# Patient Record
Sex: Female | Born: 1965 | Race: White | Hispanic: No | Marital: Married | Smoking: Never smoker
Health system: Southern US, Community
[De-identification: ages and names within clinical notes are randomized; demographics above are authoritative.]

## PROBLEM LIST (undated history)

## (undated) DIAGNOSIS — Z6830 Body mass index (BMI) 30.0-30.9, adult: Secondary | ICD-10-CM

## (undated) DIAGNOSIS — F419 Anxiety disorder, unspecified: Secondary | ICD-10-CM

## (undated) DIAGNOSIS — I10 Essential (primary) hypertension: Secondary | ICD-10-CM

## (undated) DIAGNOSIS — E782 Mixed hyperlipidemia: Secondary | ICD-10-CM

## (undated) DIAGNOSIS — R0609 Other forms of dyspnea: Secondary | ICD-10-CM

## (undated) HISTORY — DX: Mixed hyperlipidemia: E78.2

## (undated) HISTORY — DX: Body mass index (BMI) 30.0-30.9, adult: Z68.30

## (undated) HISTORY — PX: BREAST CYST ASPIRATION: SHX578

## (undated) HISTORY — DX: Other forms of dyspnea: R06.09

## (undated) HISTORY — DX: Essential (primary) hypertension: I10

## (undated) HISTORY — PX: AUGMENTATION MAMMAPLASTY: SUR837

## (undated) HISTORY — DX: Anxiety disorder, unspecified: F41.9

---

## 1998-09-01 ENCOUNTER — Other Ambulatory Visit: Admission: RE | Admit: 1998-09-01 | Discharge: 1998-09-01 | Payer: Self-pay | Admitting: Obstetrics and Gynecology

## 1998-09-13 ENCOUNTER — Ambulatory Visit (HOSPITAL_COMMUNITY): Admission: RE | Admit: 1998-09-13 | Discharge: 1998-09-13 | Payer: Self-pay | Admitting: Obstetrics and Gynecology

## 1998-09-13 ENCOUNTER — Encounter: Payer: Self-pay | Admitting: Obstetrics and Gynecology

## 2000-01-01 ENCOUNTER — Other Ambulatory Visit: Admission: RE | Admit: 2000-01-01 | Discharge: 2000-01-01 | Payer: Self-pay | Admitting: Obstetrics and Gynecology

## 2001-01-01 ENCOUNTER — Other Ambulatory Visit: Admission: RE | Admit: 2001-01-01 | Discharge: 2001-01-01 | Payer: Self-pay | Admitting: Obstetrics and Gynecology

## 2002-02-24 ENCOUNTER — Other Ambulatory Visit: Admission: RE | Admit: 2002-02-24 | Discharge: 2002-02-24 | Payer: Self-pay | Admitting: Obstetrics and Gynecology

## 2003-05-25 ENCOUNTER — Other Ambulatory Visit: Admission: RE | Admit: 2003-05-25 | Discharge: 2003-05-25 | Payer: Self-pay | Admitting: Obstetrics and Gynecology

## 2004-07-06 ENCOUNTER — Other Ambulatory Visit: Admission: RE | Admit: 2004-07-06 | Discharge: 2004-07-06 | Payer: Self-pay | Admitting: Obstetrics and Gynecology

## 2005-10-16 ENCOUNTER — Other Ambulatory Visit: Admission: RE | Admit: 2005-10-16 | Discharge: 2005-10-16 | Payer: Self-pay | Admitting: Obstetrics and Gynecology

## 2006-01-14 ENCOUNTER — Encounter (INDEPENDENT_AMBULATORY_CARE_PROVIDER_SITE_OTHER): Payer: Self-pay | Admitting: Specialist

## 2006-01-14 ENCOUNTER — Inpatient Hospital Stay (HOSPITAL_COMMUNITY): Admission: RE | Admit: 2006-01-14 | Discharge: 2006-01-15 | Payer: Self-pay | Admitting: Obstetrics and Gynecology

## 2006-11-18 ENCOUNTER — Encounter: Admission: RE | Admit: 2006-11-18 | Discharge: 2006-11-18 | Payer: Self-pay | Admitting: Obstetrics and Gynecology

## 2007-12-24 ENCOUNTER — Encounter: Admission: RE | Admit: 2007-12-24 | Discharge: 2007-12-24 | Payer: Self-pay | Admitting: Obstetrics and Gynecology

## 2009-01-31 ENCOUNTER — Encounter: Admission: RE | Admit: 2009-01-31 | Discharge: 2009-01-31 | Payer: Self-pay | Admitting: Obstetrics and Gynecology

## 2009-02-08 ENCOUNTER — Encounter: Admission: RE | Admit: 2009-02-08 | Discharge: 2009-02-08 | Payer: Self-pay | Admitting: Obstetrics and Gynecology

## 2010-10-10 ENCOUNTER — Encounter: Admission: RE | Admit: 2010-10-10 | Discharge: 2010-10-10 | Payer: Self-pay | Admitting: Obstetrics and Gynecology

## 2010-10-10 ENCOUNTER — Ambulatory Visit: Payer: Self-pay | Admitting: Oncology

## 2010-11-09 ENCOUNTER — Ambulatory Visit: Payer: Self-pay | Admitting: Genetic Counselor

## 2011-04-19 NOTE — Op Note (Signed)
[name], Stacie Miller NO.:  [id number]   MEDICAL RECORD NO.:  [id number]          PATIENT TYPE:  INP   LOCATION:  9302                          FACILITY:  [facility]   PHYSICIAN:  Malva Limes, M.D.    DATE OF BIRTH:  Sep 02, 1966   DATE OF PROCEDURE:  01/14/2006  DATE OF DISCHARGE:                                 OPERATIVE REPORT   PREOPERATIVE DIAGNOSES:  1.  Uterine fibroids.  2.  Menorrhagia.  3.  Stress urinary incontinence.  4.  Symptomatic rectocele.   POSTOPERATIVE DIAGNOSES:  1.  Uterine fibroids.  2.  Menorrhagia.  3.  Stress urinary incontinence.  4.  Symptomatic rectocele.   OPERATION/PROCEDURE:  Total vaginal hysterectomy.   SURGEON:  Malva Limes, M.D.   ASSISTANT:  Randye Lobo, M.D.   ANESTHESIA:  General.   ANTIBIOTICS:  Ancef 1 g.   ESTIMATED BLOOD LOSS:  150 mL.   SPECIMENS:  Uterus and cervix sent to pathology.   COMPLICATIONS:  None.   DRAINS:  Foley to bedside drainage.   OPERATION/PROCEDURE:  The patient was taken to the operating room where she  was placed in the dorsal supine position.  A general anesthetic was  administered without complications.  She was then placed in the dorsal  lithotomy position. She was prepped with Betadine and her bladder was  drained with the red rubber catheter.  She was then a sterile speculum was  placed in the vagina and 10 mL of 1% lidocaine used for paracervical block.  The uterus was grasped with single-tooth tenaculum.  Posterior cul-de-sac  was entered sharply. Uterosacral ligament were bilaterally clamped, cut and  ligated with 0 Monocryl suture.  The cervix was then circumscribed.  The  anterior cul-de-sac was entered sharply.  The bladder pillars were  bilaterally clamped, cut and ligated with 0 Monocryl suture.  The cardinal  ligaments were serially clamped, cut and ligated with 0 Monocryl suture.  Uterine vessels were bilaterally clamped, cut and ligated with 0 Monocryl  suture.   Once the level of the round ligament was reached, the uterus was  inverted and the triple pedicle which included the ovarian ligament, the  fallopian tube and the round ligament were bilaterally clamped, cut and  ligated x2 with 0 Monocryl suture.  Pedicles were all checked.  At this  point there was a small area of bleeding on the pedicle on the left.  This  was made hemostatic with interrupted 0 Monocryl suture in figure-of-eight  fashion.  Ovaries appear to be normal bilaterally.  At this point the  posterior cuff was closed using 2-0 Vicryl in a running locking fashion.  At  this point Dr. Edward Jolly took over the procedure at which time she performed a  TVT sling, anterior and posterior colporrhaphy.  A complete description will  be dictated by her.           ______________________________  Malva Limes, M.D.     MA/MEDQ  D:  01/14/2006  T:  01/15/2006  Job:  [id number]

## 2011-04-19 NOTE — Discharge Summary (Signed)
NAMELATRESA, GASSER NO.:  [id number]   MEDICAL RECORD NO.:  [id number]          PATIENT TYPE:  INP   LOCATION:  9302                          FACILITY:  [facility]   PHYSICIAN:  Malva Limes, M.D.    DATE OF BIRTH:  1966/03/07   DATE OF ADMISSION:  01/14/2006  DATE OF DISCHARGE:  01/15/2006                                 DISCHARGE SUMMARY   PRINCIPAL DISCHARGE DIAGNOSES:  1.  Menorrhagia.  2.  Uterine fibroids.  3.  Stress urinary incontinence.   PRINCIPAL PROCEDURES:  1.  Total vaginal hysterectomy.  2.  Placement of TVT tape.  3.  Anterior and posterior colporrhaphy.   HISTORY OF PRESENT ILLNESS:  Ms. Coulson is a 45 year old white female, G1,  P0, who presented at [facility] for a total vaginal hysterectomy with  anterior and posterior repair, and sling urethropexy on January 14, 2006.  This was performed without difficulty.  A complete description of this can  be found in the dictated operative notes.  The patient's postoperative  course was benign.  On postoperative day #1, her hemoglobin was 11.2, she  was ambulating without difficulty and eating a regular diet.  The patient  did have attempt at a voiding trial, which was not successful.  Therefore,  the patient was discharged to home with her Foley catheter in place and  instructed to followup at the office.  The patient initially had some  nausea, which was felt to be secondary to the morphine, this resolved and  the patient was discharged to home on postoperative day #1, and she was  instructed to follow up with Dr. Edward Jolly for management of her catheter.  She  was sent home with Percocet to take p.r.n.  She will follow up with me in 4  weeks.           ______________________________  Malva Limes, M.D.     MA/MEDQ  D:  02/03/2006  T:  02/03/2006  Job:  [id number]

## 2011-04-23 NOTE — Op Note (Signed)
Stacie Miller, DINGLEDINE NO.:  [id number]   MEDICAL RECORD NO.:  [id number]          PATIENT TYPE:  INP   LOCATION:  9399                          FACILITY:  [facility]   PHYSICIAN:  Randye Lobo, M.D.   DATE OF BIRTH:  1966-10-08   DATE OF PROCEDURE:  01/14/2006  DATE OF DISCHARGE:                                 OPERATIVE REPORT   PREOPERATIVE DIAGNOSIS:  1.  Genuine stress incontinence.  2.  Incomplete uterovaginal prolapse.   POSTOPERATIVE DIAGNOSIS:  1.  Genuine stress incontinence.  2.  Incomplete uterovaginal prolapse.   PROCEDURE:  McCall culdoplasty, anterior and posterior colporrhaphy, tension-  free vaginal tape, cystoscopy.   SURGEON:  Conley Simmonds, M.D.   ASSISTANT:  Malva Limes, M.D.   ANESTHESIA:  LMA, local was 1% lidocaine with 1:100,000 of epinephrine.   IV FLUIDS:  Total for procedure 1500 mL Ringer's lactate.   ESTIMATED BLOOD LOSS:  150 mL.   URINE OUTPUT:  Quantity sufficient.   COMPLICATIONS:  None.   INDICATIONS FOR PROCEDURE:  The patient is a 45 year old Caucasian female  with a Merina IUD who presented to her primary gynecologist, Dr. Malva Limes for evaluation of abnormal uterine bleeding. The patient was noted  to have uterine leiomyoma. The patient also reported symptoms of urinary  leakage with stressful maneuvers and she wished for definitive treatment of  her bleeding and urinary incontinence. The patient did have evaluation which  documented genuine stress incontinence. The plan is made to proceed with a  total vaginal hysterectomy and possible bilateral salpingo-oophorectomy by  Dr. Dareen Piano and to proceed with the prolapse repair with an anterior and  posterior colporrhaphy, tension-free vaginal tape, and cystoscopy by me.  Risks, benefits, and alternatives have been discussed with the patient who  wishes to proceed.   FINDINGS:  The patient was noted to have first-degree uterine prolapse, a  first to second-degree  cystocele, and first to second-degree rectocele. The  uterine specimen did document a 3 cm left fundal fibroid. The Maxie Better IUD was  noted to be in place in the uterus.   Cystoscopy after placement of the sling documented the absence of a foreign  body in the vagina and the urethra. The ureters were noted be patent  bilaterally. The bladder was visualized throughout 360 degrees and had a  normal bladder dome and trigone.   SPECIMENS:  None.   PROCEDURE:  The patient was reidentified in the preoperative hold area. She  did receive Ancef 1 gram IV for antibiotic prophylaxis. The patient was  taken down to the operating room where an LMA anesthetic was performed. The  vagina and the perineum were sterilely prepped and draped and she was placed  in the high lithotomy position. An I&O catheterization was performed.   Dr. Dareen Piano at this time performed a total vaginal hysterectomy. Please  refer to this dictation separately. After Dr. Dareen Piano closed the posterior  vaginal cuff with a running locked suture of 0 Vicryl, he turned the case  over to me. There was some bleeding noted along the pedicle sites on both  the right and the left-hand side just above the level of the uterosacral  ligaments and I placed figure-of-eight sutures of 0 Monocryl bilaterally  which created good hemostasis. The remaining pedicles from the hysterectomy  were hemostatic.   I performed a McCall culdoplasty next using a suture of 0 Vicryl. The suture  was brought from the vagina into the cul-de-sac at the 6 o'clock position,  through the uterosacral ligament on the patient's left-hand side, up across  the posterior cul-de-sac, down through the uterosacral ligament on the  patient's right-hand side and then out through the vagina. This was held  until the end of the case at which time it was tied for excellent elevation  of the vaginal cuff.   The anterior colporrhaphy was performed next. Allis clamps were used  to mark  the midline of the vaginal mucosa to the level of 1 cm below the urethral  meatus. The vaginal mucosa was injected with 1% lidocaine with 1:100,000 of  epinephrine. The vaginal mucosa was then incised in a vertical fashion using  the Metzenbaum scissors. The bladder and the subvaginal tissue were then  dissected off of the overlying vaginal mucosa bilaterally using a  combination of sharp and blunt dissection. The dissection was carried back  to the level of the pubic rami without entering the retropubic space  bilaterally.   The patient had a Foley catheter which was left to gravity drainage  throughout the remainder of her procedure. Two suprapubic incisions were  then marked 2 cm to the right and the left of the midline above the pubic  symphysis. The incisions were created sharply with the scalpel. The TVT  procedure was performed in a top-down fashion. The abdominal needle passer  was placed first through the right suprapubic incision and out through the  vagina lateral to the urethra at the level of the mid urethra. The same  procedure that was performed on the right-hand side was then repeated on the  left-hand side, again in a top-down fashion. Foley catheter was removed and  cystoscopy was performed and the findings were as noted above. The Foley  catheter was replaced and the bladder was redrained and the TVT sling was  attached to the abdominal needle passers and was drawn up through the  suprapubic incisions bilaterally. The plastic sheaths were separated from  the surrounding sling material and the sheaths were withdrawn while  protecting the sling below by placing a Kelly clamp between the sling and  the urethra. Final tension was adjusted and was noted to be proper for the  sling. Excess sling was then trimmed suprapubically. The anterior  colporrhaphy was performed next with vertical mattress sutures of 2-0 Vicryl for excellent reduction of the cystocele. Excess  vaginal mucosa was trimmed  and the anterior vaginal wall was closed with a running locked suture of 2-0  Vicryl. This was continued along the vaginal cuff to completely close the  cuff at this time.   The posterior colporrhaphy was performed next. Allis clamps were once again  used to mark the midline of the posterior vaginal wall which was injected  with 1% lidocaine with 1:200,000 of epinephrine. A triangular wedge of  epithelium was removed from the perineal body. The vaginal mucosa was  incised vertically posteriorly. The perirectal fascia was dissected off of  the overlying vaginal mucosa bilaterally to the top of the rectocele. The  posterior colporrhaphy was performed with a combination of 2-0 Vicryl at the  top  of the vagina which transitioned down to 0 Vicryl along the perineum.  These were all vertical mattress sutures which reduced the rectocele well.  Hemostasis was created with monopolar cautery. In the midline of the  posterior colporrhaphy there was some oozing where one of the colporrhaphy  sutures had been placed and this was reinforced with both vertical mattress  and then simple through-and-through sutures of 2-0 Vicryl which did create  hemostasis. Excess vaginal mucosa was then trimmed from the posterior vagina  and the perineum and the posterior vaginal wall was closed with a running  locked suture of 2-0 Vicryl down to the level of the hymen. A crown stitch  of 0 Vicryl was placed along the perineal body. The remainder of the  perineal closure was performed with a suture of 2-0 Vicryl as for an  episiotomy. The perineum was closed with a subcuticular suture and the knot  was then tied at the hymen. The McCall culdoplasty suture was then tied and  there was excellent elevation and support of the vaginal cuff.   Final rectal exam confirmed the absence of sutures in the rectum. The  suprapubic incisions were closed with Dermabond. A vaginal gauze packing  with  Estrace was placed in the vagina. The Foley catheter was left to  gravity drainage.   This concluded the patient's procedure. There were no complications. All  needle, instrument, sponge counts were correct.      Randye Lobo, M.D.  Electronically Signed     BES/MEDQ  D:  01/14/2006  T:  01/14/2006  Job:  [id number]

## 2011-07-16 ENCOUNTER — Other Ambulatory Visit: Payer: Self-pay | Admitting: Obstetrics and Gynecology

## 2011-07-16 DIAGNOSIS — N6320 Unspecified lump in the left breast, unspecified quadrant: Secondary | ICD-10-CM

## 2011-07-19 ENCOUNTER — Ambulatory Visit
Admission: RE | Admit: 2011-07-19 | Discharge: 2011-07-19 | Disposition: A | Discharge status: Home / Self Care | Payer: BC Managed Care – PPO | Source: Ambulatory Visit | Attending: Obstetrics and Gynecology | Admitting: Obstetrics and Gynecology

## 2011-07-19 ENCOUNTER — Other Ambulatory Visit: Payer: Self-pay | Admitting: Obstetrics and Gynecology

## 2011-07-19 DIAGNOSIS — N6320 Unspecified lump in the left breast, unspecified quadrant: Secondary | ICD-10-CM

## 2011-07-19 DIAGNOSIS — N6310 Unspecified lump in the right breast, unspecified quadrant: Secondary | ICD-10-CM

## 2011-10-14 ENCOUNTER — Other Ambulatory Visit: Payer: Self-pay | Admitting: Obstetrics and Gynecology

## 2011-10-14 DIAGNOSIS — N6320 Unspecified lump in the left breast, unspecified quadrant: Secondary | ICD-10-CM

## 2011-10-28 ENCOUNTER — Ambulatory Visit
Admission: RE | Admit: 2011-10-28 | Discharge: 2011-10-28 | Disposition: A | Discharge status: Home / Self Care | Payer: BC Managed Care – PPO | Source: Ambulatory Visit | Attending: Obstetrics and Gynecology | Admitting: Obstetrics and Gynecology

## 2011-10-28 DIAGNOSIS — N6320 Unspecified lump in the left breast, unspecified quadrant: Secondary | ICD-10-CM

## 2012-10-22 ENCOUNTER — Other Ambulatory Visit: Payer: Self-pay | Admitting: Obstetrics and Gynecology

## 2012-10-22 DIAGNOSIS — Z1231 Encounter for screening mammogram for malignant neoplasm of breast: Secondary | ICD-10-CM

## 2012-12-01 ENCOUNTER — Other Ambulatory Visit: Payer: Self-pay | Admitting: Obstetrics and Gynecology

## 2012-12-01 ENCOUNTER — Ambulatory Visit
Admission: RE | Admit: 2012-12-01 | Discharge: 2012-12-01 | Disposition: A | Discharge status: Home / Self Care | Payer: BC Managed Care – PPO | Source: Ambulatory Visit | Attending: Obstetrics and Gynecology | Admitting: Obstetrics and Gynecology

## 2012-12-01 DIAGNOSIS — Z1231 Encounter for screening mammogram for malignant neoplasm of breast: Secondary | ICD-10-CM

## 2012-12-08 ENCOUNTER — Other Ambulatory Visit: Payer: Self-pay | Admitting: Obstetrics and Gynecology

## 2012-12-08 DIAGNOSIS — R928 Other abnormal and inconclusive findings on diagnostic imaging of breast: Secondary | ICD-10-CM

## 2012-12-09 ENCOUNTER — Ambulatory Visit
Admission: RE | Admit: 2012-12-09 | Discharge: 2012-12-09 | Disposition: A | Discharge status: Home / Self Care | Payer: BC Managed Care – PPO | Source: Ambulatory Visit | Attending: Obstetrics and Gynecology | Admitting: Obstetrics and Gynecology

## 2012-12-09 DIAGNOSIS — R928 Other abnormal and inconclusive findings on diagnostic imaging of breast: Secondary | ICD-10-CM

## 2013-11-04 ENCOUNTER — Other Ambulatory Visit: Payer: Self-pay

## 2013-11-04 DIAGNOSIS — Z1231 Encounter for screening mammogram for malignant neoplasm of breast: Secondary | ICD-10-CM

## 2013-12-10 ENCOUNTER — Ambulatory Visit
Admission: RE | Admit: 2013-12-10 | Discharge: 2013-12-10 | Disposition: A | Discharge status: Home / Self Care | Payer: BC Managed Care – PPO | Source: Ambulatory Visit

## 2013-12-10 ENCOUNTER — Other Ambulatory Visit: Payer: Self-pay

## 2013-12-10 DIAGNOSIS — Z1231 Encounter for screening mammogram for malignant neoplasm of breast: Secondary | ICD-10-CM

## 2013-12-10 DIAGNOSIS — Z9882 Breast implant status: Secondary | ICD-10-CM

## 2014-07-25 ENCOUNTER — Other Ambulatory Visit: Payer: Self-pay | Admitting: Obstetrics and Gynecology

## 2014-07-25 DIAGNOSIS — N6002 Solitary cyst of left breast: Secondary | ICD-10-CM

## 2014-07-27 ENCOUNTER — Ambulatory Visit
Admission: RE | Admit: 2014-07-27 | Discharge: 2014-07-27 | Disposition: A | Discharge status: Home / Self Care | Payer: BC Managed Care – PPO | Source: Ambulatory Visit | Attending: Obstetrics and Gynecology | Admitting: Obstetrics and Gynecology

## 2014-07-27 ENCOUNTER — Encounter (INDEPENDENT_AMBULATORY_CARE_PROVIDER_SITE_OTHER): Payer: Self-pay

## 2014-07-27 DIAGNOSIS — N6002 Solitary cyst of left breast: Secondary | ICD-10-CM

## 2014-07-29 ENCOUNTER — Other Ambulatory Visit: Payer: Self-pay | Admitting: Obstetrics and Gynecology

## 2014-07-29 DIAGNOSIS — Z803 Family history of malignant neoplasm of breast: Secondary | ICD-10-CM

## 2014-08-02 ENCOUNTER — Other Ambulatory Visit: Payer: BC Managed Care – PPO

## 2014-08-09 ENCOUNTER — Other Ambulatory Visit: Payer: BC Managed Care – PPO

## 2014-08-19 ENCOUNTER — Inpatient Hospital Stay: Admission: RE | Admit: 2014-08-19 | Payer: BC Managed Care – PPO | Source: Ambulatory Visit

## 2014-10-24 ENCOUNTER — Other Ambulatory Visit: Payer: Self-pay | Admitting: Obstetrics and Gynecology

## 2014-10-25 LAB — CYTOLOGY - PAP

## 2015-01-10 ENCOUNTER — Other Ambulatory Visit: Payer: Self-pay

## 2015-01-10 DIAGNOSIS — Z1231 Encounter for screening mammogram for malignant neoplasm of breast: Secondary | ICD-10-CM

## 2015-01-20 ENCOUNTER — Ambulatory Visit
Admission: RE | Admit: 2015-01-20 | Discharge: 2015-01-20 | Disposition: A | Discharge status: Home / Self Care | Payer: BLUE CROSS/BLUE SHIELD | Source: Ambulatory Visit

## 2015-01-20 DIAGNOSIS — Z1231 Encounter for screening mammogram for malignant neoplasm of breast: Secondary | ICD-10-CM

## 2016-01-02 ENCOUNTER — Other Ambulatory Visit: Payer: Self-pay

## 2016-01-02 DIAGNOSIS — Z1231 Encounter for screening mammogram for malignant neoplasm of breast: Secondary | ICD-10-CM

## 2016-01-23 ENCOUNTER — Ambulatory Visit
Admission: RE | Admit: 2016-01-23 | Discharge: 2016-01-23 | Disposition: A | Discharge status: Home / Self Care | Payer: BLUE CROSS/BLUE SHIELD | Source: Ambulatory Visit

## 2016-01-23 DIAGNOSIS — Z1231 Encounter for screening mammogram for malignant neoplasm of breast: Secondary | ICD-10-CM

## 2017-01-02 ENCOUNTER — Other Ambulatory Visit: Payer: Self-pay | Admitting: Obstetrics and Gynecology

## 2017-01-02 DIAGNOSIS — Z1231 Encounter for screening mammogram for malignant neoplasm of breast: Secondary | ICD-10-CM

## 2017-02-03 ENCOUNTER — Ambulatory Visit
Admission: RE | Admit: 2017-02-03 | Discharge: 2017-02-03 | Disposition: A | Discharge status: Home / Self Care | Payer: No Typology Code available for payment source | Source: Ambulatory Visit | Attending: Obstetrics and Gynecology | Admitting: Obstetrics and Gynecology

## 2017-02-03 DIAGNOSIS — Z1231 Encounter for screening mammogram for malignant neoplasm of breast: Secondary | ICD-10-CM

## 2018-01-21 ENCOUNTER — Other Ambulatory Visit: Payer: Self-pay | Admitting: Obstetrics and Gynecology

## 2018-01-22 ENCOUNTER — Other Ambulatory Visit: Payer: Self-pay | Admitting: Obstetrics and Gynecology

## 2018-01-22 DIAGNOSIS — N6310 Unspecified lump in the right breast, unspecified quadrant: Secondary | ICD-10-CM

## 2018-01-27 ENCOUNTER — Ambulatory Visit
Admission: RE | Admit: 2018-01-27 | Discharge: 2018-01-27 | Disposition: A | Discharge status: Home / Self Care | Payer: No Typology Code available for payment source | Source: Ambulatory Visit | Attending: Obstetrics and Gynecology | Admitting: Obstetrics and Gynecology

## 2018-01-27 DIAGNOSIS — N6310 Unspecified lump in the right breast, unspecified quadrant: Secondary | ICD-10-CM

## 2018-02-11 ENCOUNTER — Other Ambulatory Visit: Payer: Self-pay | Admitting: Cardiology

## 2018-02-11 DIAGNOSIS — F909 Attention-deficit hyperactivity disorder, unspecified type: Secondary | ICD-10-CM

## 2018-02-11 DIAGNOSIS — N6320 Unspecified lump in the left breast, unspecified quadrant: Secondary | ICD-10-CM

## 2018-02-11 DIAGNOSIS — G43909 Migraine, unspecified, not intractable, without status migrainosus: Secondary | ICD-10-CM | POA: Insufficient documentation

## 2018-02-11 DIAGNOSIS — N920 Excessive and frequent menstruation with regular cycle: Secondary | ICD-10-CM | POA: Insufficient documentation

## 2018-02-11 HISTORY — DX: Attention-deficit hyperactivity disorder, unspecified type: F90.9

## 2018-02-11 HISTORY — DX: Migraine, unspecified, not intractable, without status migrainosus: G43.909

## 2018-02-16 ENCOUNTER — Ambulatory Visit
Admission: RE | Admit: 2018-02-16 | Discharge: 2018-02-16 | Disposition: A | Discharge status: Home / Self Care | Payer: No Typology Code available for payment source | Source: Ambulatory Visit | Attending: Cardiology | Admitting: Cardiology

## 2018-02-16 ENCOUNTER — Other Ambulatory Visit: Payer: Self-pay | Admitting: Cardiology

## 2018-02-16 DIAGNOSIS — N6320 Unspecified lump in the left breast, unspecified quadrant: Secondary | ICD-10-CM

## 2018-02-19 ENCOUNTER — Other Ambulatory Visit: Payer: Self-pay | Admitting: Obstetrics and Gynecology

## 2018-02-19 DIAGNOSIS — N6320 Unspecified lump in the left breast, unspecified quadrant: Secondary | ICD-10-CM

## 2018-12-16 DIAGNOSIS — S62113A Displaced fracture of triquetrum [cuneiform] bone, unspecified wrist, initial encounter for closed fracture: Secondary | ICD-10-CM | POA: Insufficient documentation

## 2018-12-16 HISTORY — DX: Displaced fracture of triquetrum (cuneiform) bone, unspecified wrist, initial encounter for closed fracture: S62.113A

## 2019-05-19 ENCOUNTER — Other Ambulatory Visit: Payer: Self-pay | Admitting: Obstetrics and Gynecology

## 2019-05-19 DIAGNOSIS — Z1231 Encounter for screening mammogram for malignant neoplasm of breast: Secondary | ICD-10-CM

## 2019-07-02 ENCOUNTER — Ambulatory Visit
Admission: RE | Admit: 2019-07-02 | Discharge: 2019-07-02 | Disposition: A | Discharge status: Home / Self Care | Payer: No Typology Code available for payment source | Source: Ambulatory Visit | Attending: Obstetrics and Gynecology | Admitting: Obstetrics and Gynecology

## 2019-07-02 ENCOUNTER — Other Ambulatory Visit: Payer: Self-pay

## 2019-07-02 DIAGNOSIS — Z1231 Encounter for screening mammogram for malignant neoplasm of breast: Secondary | ICD-10-CM

## 2019-07-05 ENCOUNTER — Other Ambulatory Visit: Payer: Self-pay | Admitting: Obstetrics and Gynecology

## 2019-07-05 DIAGNOSIS — R928 Other abnormal and inconclusive findings on diagnostic imaging of breast: Secondary | ICD-10-CM

## 2019-07-06 ENCOUNTER — Ambulatory Visit
Admission: RE | Admit: 2019-07-06 | Discharge: 2019-07-06 | Disposition: A | Discharge status: Home / Self Care | Payer: No Typology Code available for payment source | Source: Ambulatory Visit | Attending: Obstetrics and Gynecology | Admitting: Obstetrics and Gynecology

## 2019-07-06 ENCOUNTER — Other Ambulatory Visit: Payer: Self-pay | Admitting: Obstetrics and Gynecology

## 2019-07-06 ENCOUNTER — Other Ambulatory Visit: Payer: Self-pay

## 2019-07-06 DIAGNOSIS — R928 Other abnormal and inconclusive findings on diagnostic imaging of breast: Secondary | ICD-10-CM

## 2020-03-16 ENCOUNTER — Other Ambulatory Visit: Payer: Self-pay | Admitting: Obstetrics and Gynecology

## 2020-03-16 DIAGNOSIS — N6320 Unspecified lump in the left breast, unspecified quadrant: Secondary | ICD-10-CM

## 2020-03-27 ENCOUNTER — Ambulatory Visit
Admission: RE | Admit: 2020-03-27 | Discharge: 2020-03-27 | Disposition: A | Discharge status: Home / Self Care | Payer: No Typology Code available for payment source | Source: Ambulatory Visit | Attending: Obstetrics and Gynecology | Admitting: Obstetrics and Gynecology

## 2020-03-27 ENCOUNTER — Other Ambulatory Visit: Payer: Self-pay

## 2020-03-27 DIAGNOSIS — N6320 Unspecified lump in the left breast, unspecified quadrant: Secondary | ICD-10-CM

## 2021-02-19 ENCOUNTER — Other Ambulatory Visit: Payer: Self-pay | Admitting: Obstetrics and Gynecology

## 2021-02-19 DIAGNOSIS — Z1231 Encounter for screening mammogram for malignant neoplasm of breast: Secondary | ICD-10-CM

## 2021-04-03 DIAGNOSIS — N6002 Solitary cyst of left breast: Secondary | ICD-10-CM

## 2021-04-03 HISTORY — DX: Solitary cyst of left breast: N60.02

## 2021-04-12 ENCOUNTER — Other Ambulatory Visit: Payer: Self-pay

## 2021-04-12 ENCOUNTER — Ambulatory Visit
Admission: RE | Admit: 2021-04-12 | Discharge: 2021-04-12 | Disposition: A | Discharge status: Home / Self Care | Payer: 59 | Source: Ambulatory Visit | Attending: Obstetrics and Gynecology | Admitting: Obstetrics and Gynecology

## 2021-04-12 DIAGNOSIS — Z1231 Encounter for screening mammogram for malignant neoplasm of breast: Secondary | ICD-10-CM

## 2021-12-07 DIAGNOSIS — G4733 Obstructive sleep apnea (adult) (pediatric): Secondary | ICD-10-CM | POA: Diagnosis not present

## 2021-12-07 DIAGNOSIS — G2581 Restless legs syndrome: Secondary | ICD-10-CM | POA: Diagnosis not present

## 2022-02-13 DIAGNOSIS — Z6829 Body mass index (BMI) 29.0-29.9, adult: Secondary | ICD-10-CM | POA: Diagnosis not present

## 2022-02-13 DIAGNOSIS — N951 Menopausal and female climacteric states: Secondary | ICD-10-CM | POA: Diagnosis not present

## 2022-02-13 DIAGNOSIS — Z1331 Encounter for screening for depression: Secondary | ICD-10-CM | POA: Diagnosis not present

## 2022-02-13 DIAGNOSIS — Z0000 Encounter for general adult medical examination without abnormal findings: Secondary | ICD-10-CM | POA: Diagnosis not present

## 2022-02-14 DIAGNOSIS — N951 Menopausal and female climacteric states: Secondary | ICD-10-CM | POA: Diagnosis not present

## 2022-03-05 ENCOUNTER — Other Ambulatory Visit: Payer: Self-pay | Admitting: Obstetrics and Gynecology

## 2022-03-05 DIAGNOSIS — Z1231 Encounter for screening mammogram for malignant neoplasm of breast: Secondary | ICD-10-CM

## 2022-04-17 ENCOUNTER — Ambulatory Visit
Admission: RE | Admit: 2022-04-17 | Discharge: 2022-04-17 | Disposition: A | Discharge status: Home / Self Care | Payer: BC Managed Care – PPO | Source: Ambulatory Visit | Attending: Obstetrics and Gynecology | Admitting: Obstetrics and Gynecology

## 2022-04-17 DIAGNOSIS — Z1231 Encounter for screening mammogram for malignant neoplasm of breast: Secondary | ICD-10-CM

## 2022-04-19 ENCOUNTER — Other Ambulatory Visit: Payer: Self-pay | Admitting: Obstetrics and Gynecology

## 2022-04-19 DIAGNOSIS — N6320 Unspecified lump in the left breast, unspecified quadrant: Secondary | ICD-10-CM

## 2022-05-07 ENCOUNTER — Ambulatory Visit
Admission: RE | Admit: 2022-05-07 | Discharge: 2022-05-07 | Disposition: A | Discharge status: Home / Self Care | Payer: BC Managed Care – PPO | Source: Ambulatory Visit | Attending: Obstetrics and Gynecology | Admitting: Obstetrics and Gynecology

## 2022-05-07 DIAGNOSIS — N6320 Unspecified lump in the left breast, unspecified quadrant: Secondary | ICD-10-CM

## 2022-05-13 ENCOUNTER — Other Ambulatory Visit: Payer: BC Managed Care – PPO

## 2022-10-17 ENCOUNTER — Other Ambulatory Visit: Payer: Self-pay | Admitting: Obstetrics and Gynecology

## 2022-10-17 DIAGNOSIS — N6320 Unspecified lump in the left breast, unspecified quadrant: Secondary | ICD-10-CM

## 2022-11-08 ENCOUNTER — Other Ambulatory Visit: Payer: BC Managed Care – PPO

## 2022-11-15 DIAGNOSIS — G4733 Obstructive sleep apnea (adult) (pediatric): Secondary | ICD-10-CM | POA: Diagnosis not present

## 2022-12-09 DIAGNOSIS — G4733 Obstructive sleep apnea (adult) (pediatric): Secondary | ICD-10-CM | POA: Diagnosis not present

## 2022-12-09 DIAGNOSIS — G2581 Restless legs syndrome: Secondary | ICD-10-CM | POA: Diagnosis not present

## 2022-12-16 DIAGNOSIS — G4733 Obstructive sleep apnea (adult) (pediatric): Secondary | ICD-10-CM | POA: Diagnosis not present

## 2023-01-06 DIAGNOSIS — J101 Influenza due to other identified influenza virus with other respiratory manifestations: Secondary | ICD-10-CM | POA: Diagnosis not present

## 2023-01-06 DIAGNOSIS — G4733 Obstructive sleep apnea (adult) (pediatric): Secondary | ICD-10-CM | POA: Diagnosis not present

## 2023-01-06 DIAGNOSIS — Z20822 Contact with and (suspected) exposure to covid-19: Secondary | ICD-10-CM | POA: Diagnosis not present

## 2023-01-06 DIAGNOSIS — I10 Essential (primary) hypertension: Secondary | ICD-10-CM | POA: Diagnosis not present

## 2023-01-16 DIAGNOSIS — G4733 Obstructive sleep apnea (adult) (pediatric): Secondary | ICD-10-CM | POA: Diagnosis not present

## 2023-01-22 ENCOUNTER — Ambulatory Visit
Admission: RE | Admit: 2023-01-22 | Discharge: 2023-01-22 | Disposition: A | Discharge status: Home / Self Care | Payer: BC Managed Care – PPO | Source: Ambulatory Visit | Attending: Obstetrics and Gynecology | Admitting: Obstetrics and Gynecology

## 2023-01-22 ENCOUNTER — Other Ambulatory Visit: Payer: Self-pay | Admitting: Obstetrics and Gynecology

## 2023-01-22 DIAGNOSIS — N6320 Unspecified lump in the left breast, unspecified quadrant: Secondary | ICD-10-CM

## 2023-01-22 DIAGNOSIS — N6002 Solitary cyst of left breast: Secondary | ICD-10-CM | POA: Diagnosis not present

## 2023-01-22 DIAGNOSIS — R922 Inconclusive mammogram: Secondary | ICD-10-CM | POA: Diagnosis not present

## 2023-02-04 DIAGNOSIS — G4733 Obstructive sleep apnea (adult) (pediatric): Secondary | ICD-10-CM | POA: Diagnosis not present

## 2023-02-04 DIAGNOSIS — G2581 Restless legs syndrome: Secondary | ICD-10-CM | POA: Diagnosis not present

## 2023-03-26 ENCOUNTER — Other Ambulatory Visit: Payer: Self-pay | Admitting: Obstetrics and Gynecology

## 2023-03-26 DIAGNOSIS — Z1231 Encounter for screening mammogram for malignant neoplasm of breast: Secondary | ICD-10-CM

## 2023-05-01 DIAGNOSIS — G4733 Obstructive sleep apnea (adult) (pediatric): Secondary | ICD-10-CM | POA: Diagnosis not present

## 2023-05-01 DIAGNOSIS — Z6829 Body mass index (BMI) 29.0-29.9, adult: Secondary | ICD-10-CM | POA: Diagnosis not present

## 2023-05-01 HISTORY — DX: Obstructive sleep apnea (adult) (pediatric): G47.33

## 2023-05-01 HISTORY — DX: Body mass index (BMI) 29.0-29.9, adult: Z68.29

## 2023-05-06 DIAGNOSIS — G2581 Restless legs syndrome: Secondary | ICD-10-CM | POA: Diagnosis not present

## 2023-05-06 DIAGNOSIS — G4733 Obstructive sleep apnea (adult) (pediatric): Secondary | ICD-10-CM | POA: Diagnosis not present

## 2023-05-08 DIAGNOSIS — Z01419 Encounter for gynecological examination (general) (routine) without abnormal findings: Secondary | ICD-10-CM | POA: Diagnosis not present

## 2023-05-08 DIAGNOSIS — Z6830 Body mass index (BMI) 30.0-30.9, adult: Secondary | ICD-10-CM | POA: Diagnosis not present

## 2023-05-09 ENCOUNTER — Other Ambulatory Visit: Payer: Self-pay | Admitting: Obstetrics and Gynecology

## 2023-05-09 ENCOUNTER — Ambulatory Visit
Admission: RE | Admit: 2023-05-09 | Discharge: 2023-05-09 | Disposition: A | Discharge status: Home / Self Care | Payer: BC Managed Care – PPO | Source: Ambulatory Visit | Attending: Obstetrics and Gynecology | Admitting: Obstetrics and Gynecology

## 2023-05-09 DIAGNOSIS — Z1231 Encounter for screening mammogram for malignant neoplasm of breast: Secondary | ICD-10-CM

## 2023-05-27 DIAGNOSIS — G4733 Obstructive sleep apnea (adult) (pediatric): Secondary | ICD-10-CM | POA: Diagnosis not present

## 2023-06-03 DIAGNOSIS — F909 Attention-deficit hyperactivity disorder, unspecified type: Secondary | ICD-10-CM | POA: Diagnosis not present

## 2023-06-03 DIAGNOSIS — Z1331 Encounter for screening for depression: Secondary | ICD-10-CM | POA: Diagnosis not present

## 2023-06-03 DIAGNOSIS — Z1339 Encounter for screening examination for other mental health and behavioral disorders: Secondary | ICD-10-CM | POA: Diagnosis not present

## 2023-06-03 DIAGNOSIS — Z0000 Encounter for general adult medical examination without abnormal findings: Secondary | ICD-10-CM | POA: Diagnosis not present

## 2023-06-13 DIAGNOSIS — G4733 Obstructive sleep apnea (adult) (pediatric): Secondary | ICD-10-CM | POA: Diagnosis not present

## 2023-08-07 DIAGNOSIS — G4733 Obstructive sleep apnea (adult) (pediatric): Secondary | ICD-10-CM | POA: Diagnosis not present

## 2023-08-12 DIAGNOSIS — G4733 Obstructive sleep apnea (adult) (pediatric): Secondary | ICD-10-CM | POA: Diagnosis not present

## 2023-08-12 DIAGNOSIS — G2581 Restless legs syndrome: Secondary | ICD-10-CM | POA: Diagnosis not present

## 2023-09-14 IMAGING — US US BREAST*L* LIMITED INC AXILLA
1 series · 5 of 5 positions shown · non-contrast
Comparison: Previous exam(s).

CLINICAL DATA: Patient describes a lump in the lower LEFT breast
which fluctuates in size. History of LEFT breast cyst.

EXAM:
DIGITAL DIAGNOSTIC BILATERAL MAMMOGRAM WITH IMPLANTS, CAD AND
TOMOSYNTHESIS; ULTRASOUND LEFT BREAST LIMITED
TECHNIQUE: Bilateral digital diagnostic mammography and breast tomosynthesis
was performed. The images were evaluated with computer-aided
detection. Standard and/or implant displaced views were performed.;
Targeted ultrasound examination of the left breast was performed.

[Series 1: us breast*left* limited inc axilla · 0.07mm/px · 5 of 5 slices shown]
[im 1/5]
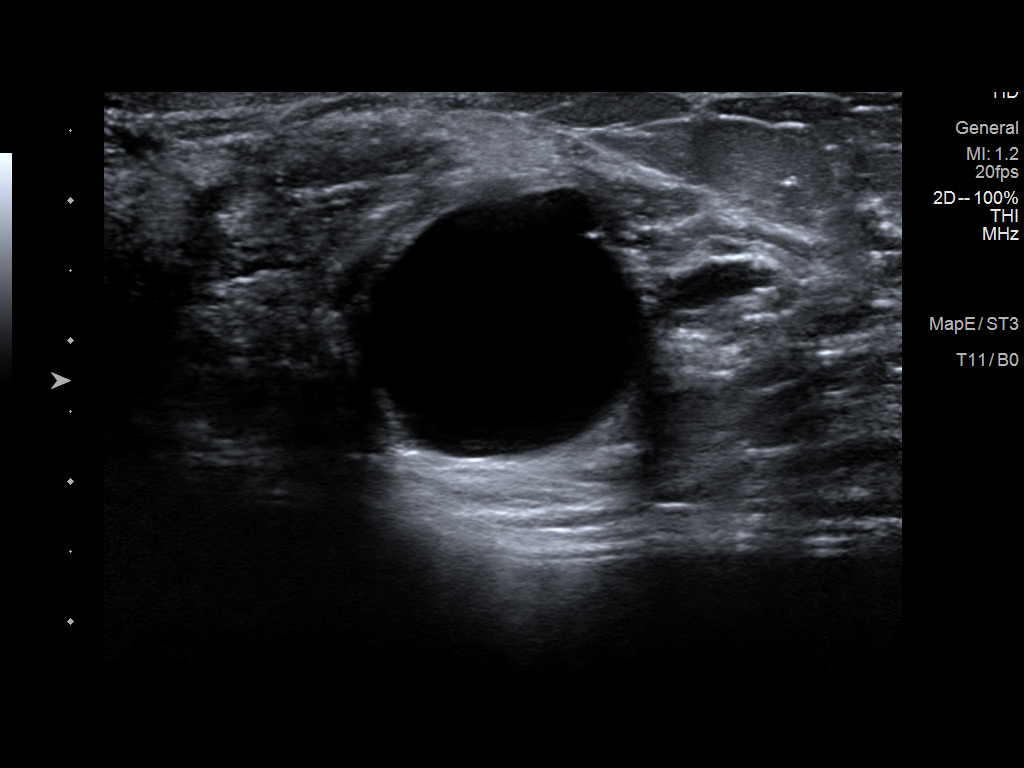
[im 2/5]
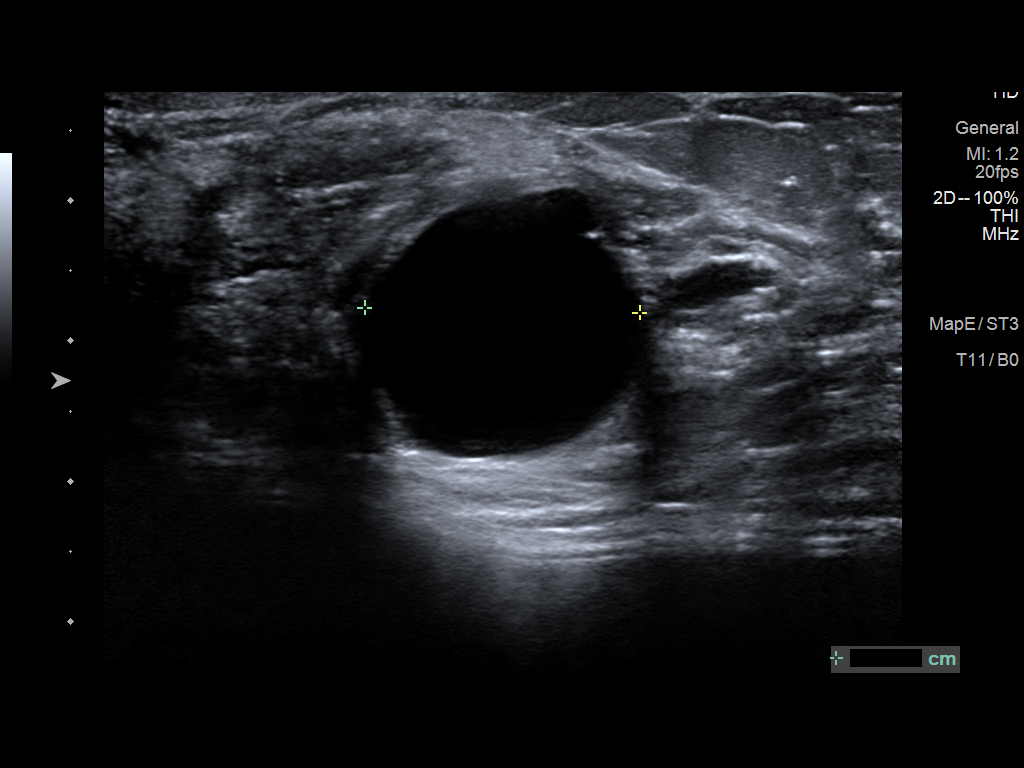
[im 3/5]
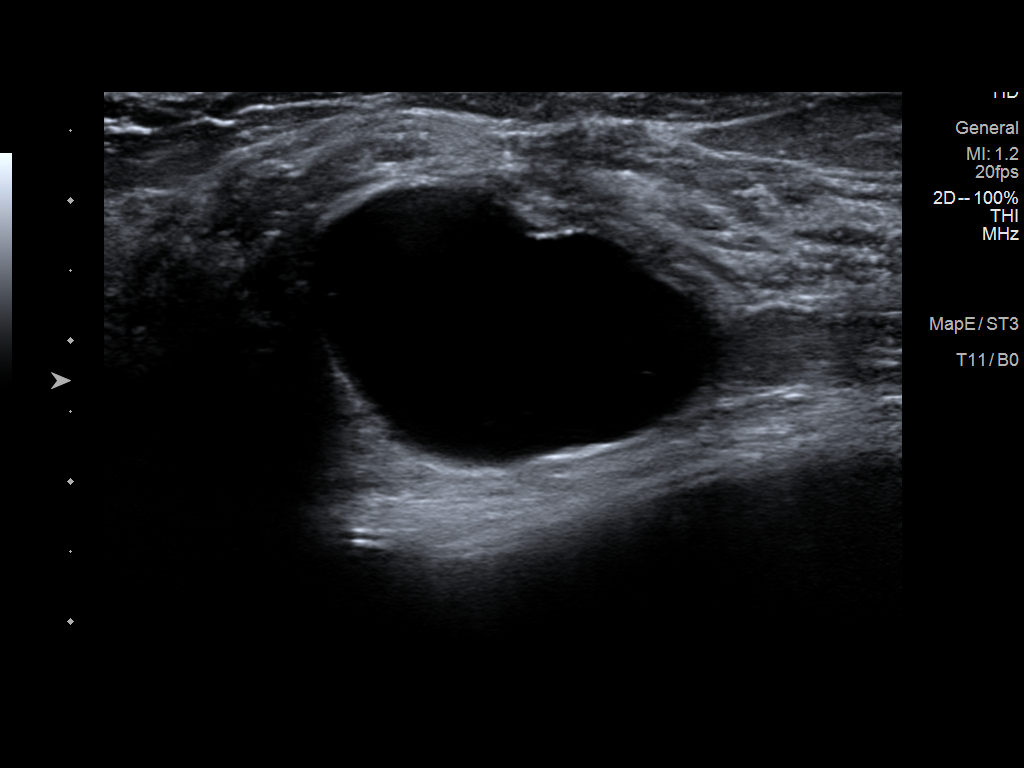
[im 4/5]
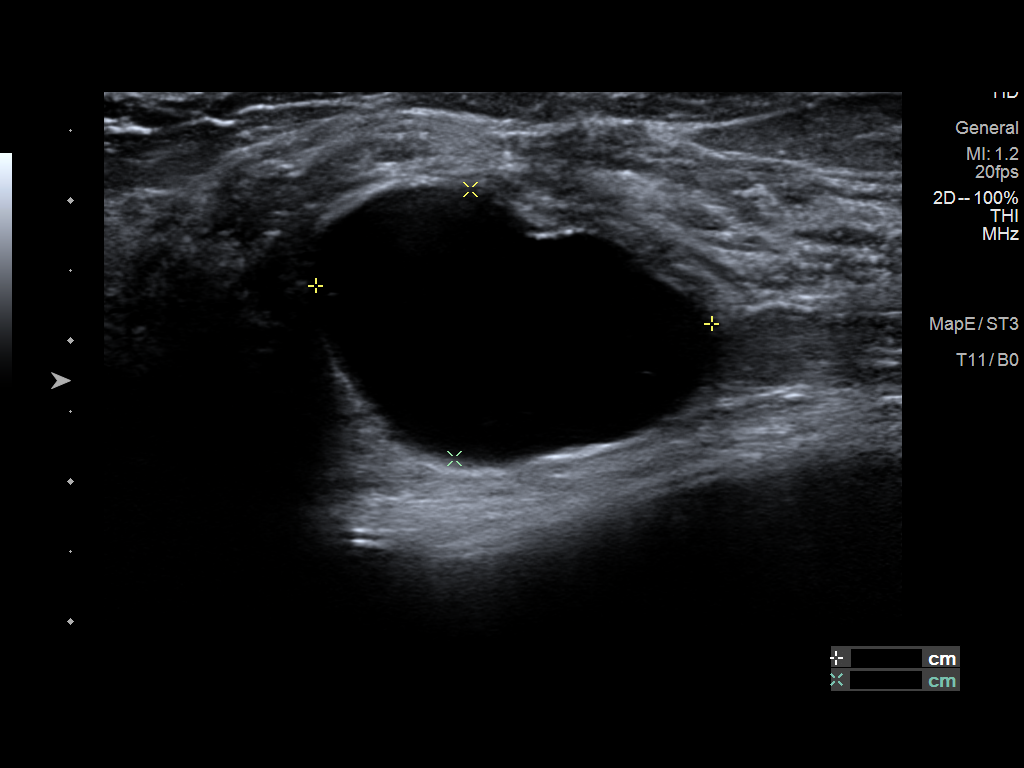
[im 5/5]
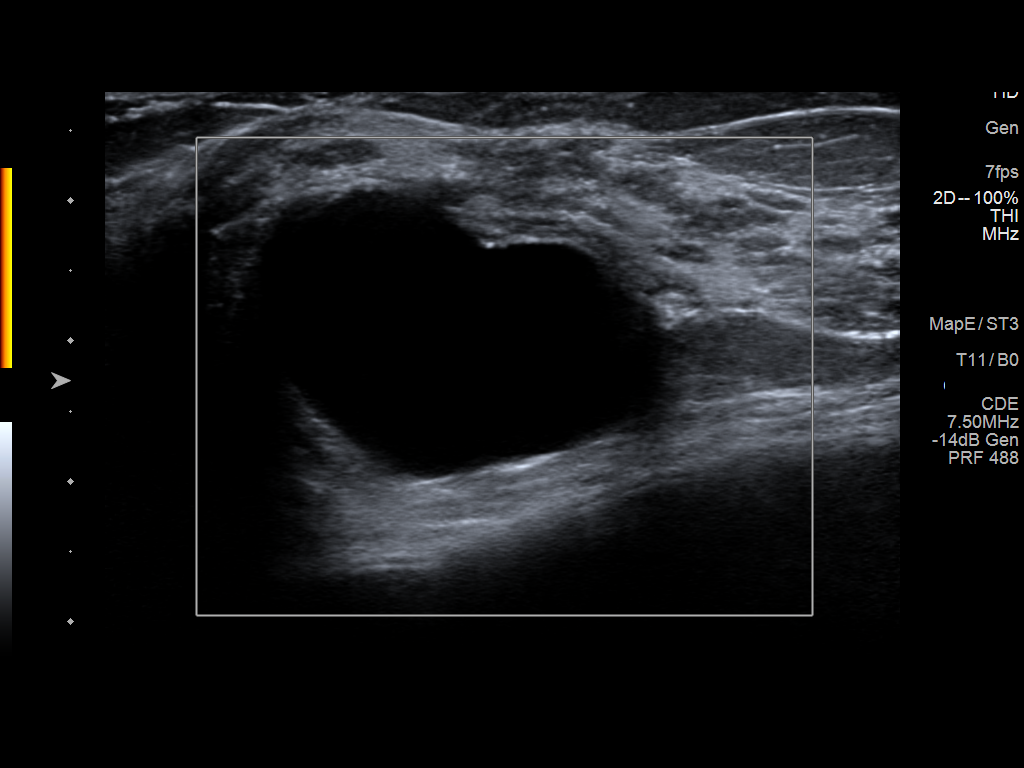

[5 of 5 positions shown; findings below may reference images not displayed]

ACR Breast Density Category d: The breast tissue is extremely dense,
which lowers the sensitivity of mammography.
FINDINGS: There are no new dominant masses, suspicious calcifications or
secondary signs of malignancy within either breast. There is a
partially obscured mass within the lower LEFT breast, corresponding
to the site of a previously demonstrated cyst, perhaps slightly
enlarged compared to the previous exams. The patient has
retropectoral implants.

Targeted ultrasound is performed, showing a benign simple cyst in
the LEFT breast at the 6 o'clock axis, measuring 2.8 x 1.9 x 2 cm,
increased in size compared to the previous ultrasound of 03/27/2020
(previously 2.4 x 1.4 x 1.8 cm), corresponding to the palpable area
of concern. No additional solid or cystic mass is seen within the
surrounding breast tissues of the lower LEFT breast.
IMPRESSION: 1. No evidence of malignancy within either breast.
2. Benign simple cyst in the LEFT breast at the 6 o'clock axis,
measuring 2.8 cm, increased compared to the previous ultrasound of
03/27/2020, corresponding to the palpable area of concern.

RECOMMENDATION:
Screening mammogram in one year.(Code:1B-Y-PF0)

I have discussed the findings and recommendations with the patient.
If applicable, a reminder letter will be sent to the patient
regarding the next appointment.

BI-RADS CATEGORY  2: Benign.

## 2023-09-14 IMAGING — MG MM  DIGITAL DIAGNOSTIC BREAST BILAT IMPLANT W/ TOMO W/ CAD
8 of 14 series · 8 of 34 positions shown · non-contrast
Comparison: Previous exam(s).

CLINICAL DATA: Patient describes a lump in the lower LEFT breast
which fluctuates in size. History of LEFT breast cyst.

EXAM:
DIGITAL DIAGNOSTIC BILATERAL MAMMOGRAM WITH IMPLANTS, CAD AND
TOMOSYNTHESIS; ULTRASOUND LEFT BREAST LIMITED
TECHNIQUE: Bilateral digital diagnostic mammography and breast tomosynthesis
was performed. The images were evaluated with computer-aided
detection. Standard and/or implant displaced views were performed.;
Targeted ultrasound examination of the left breast was performed.

[L CC]
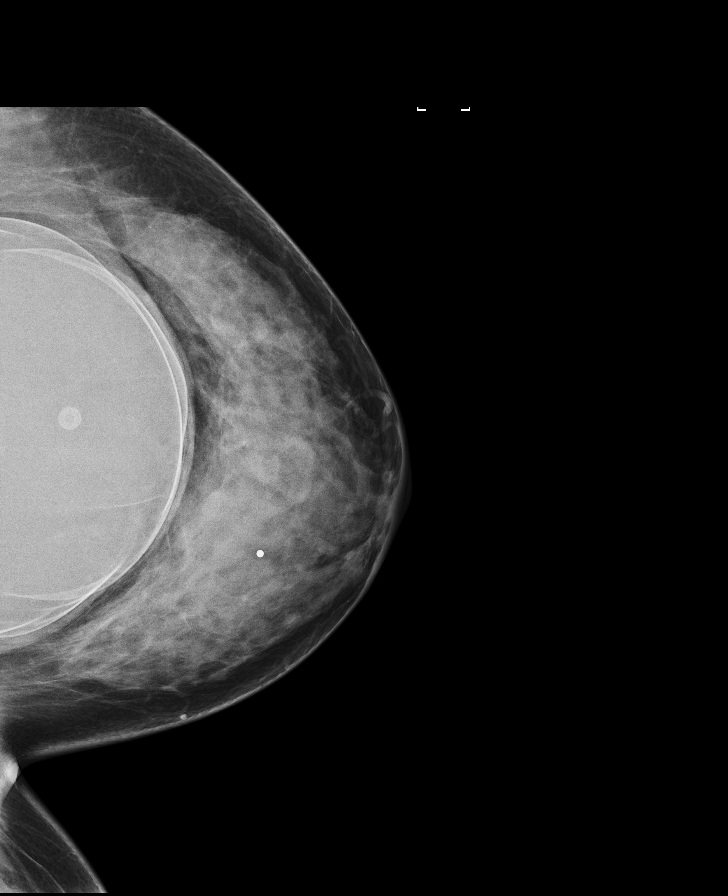

[R CC]
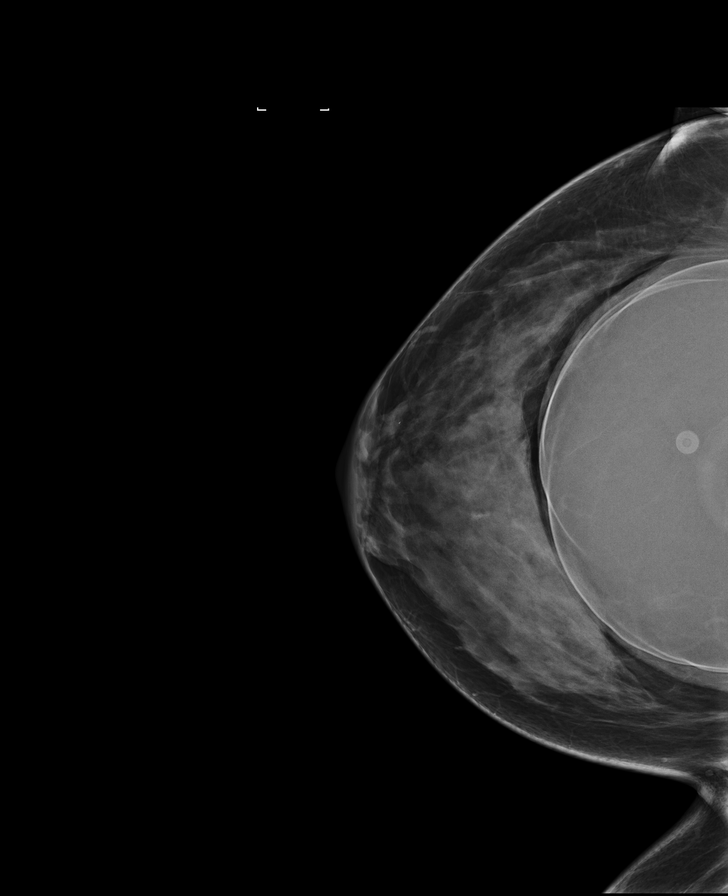

[L MLO]
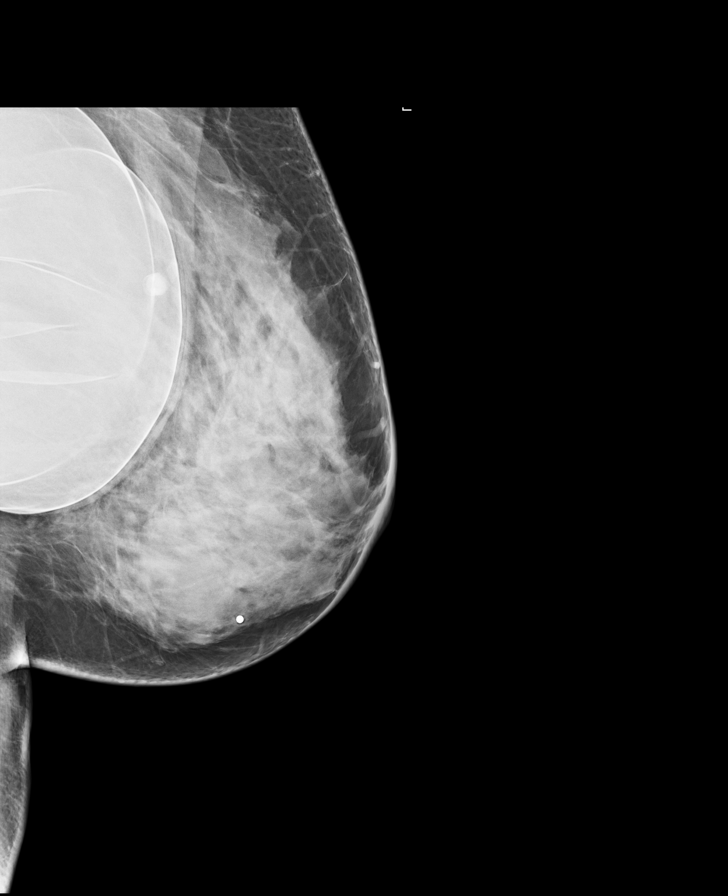

[R MLO]
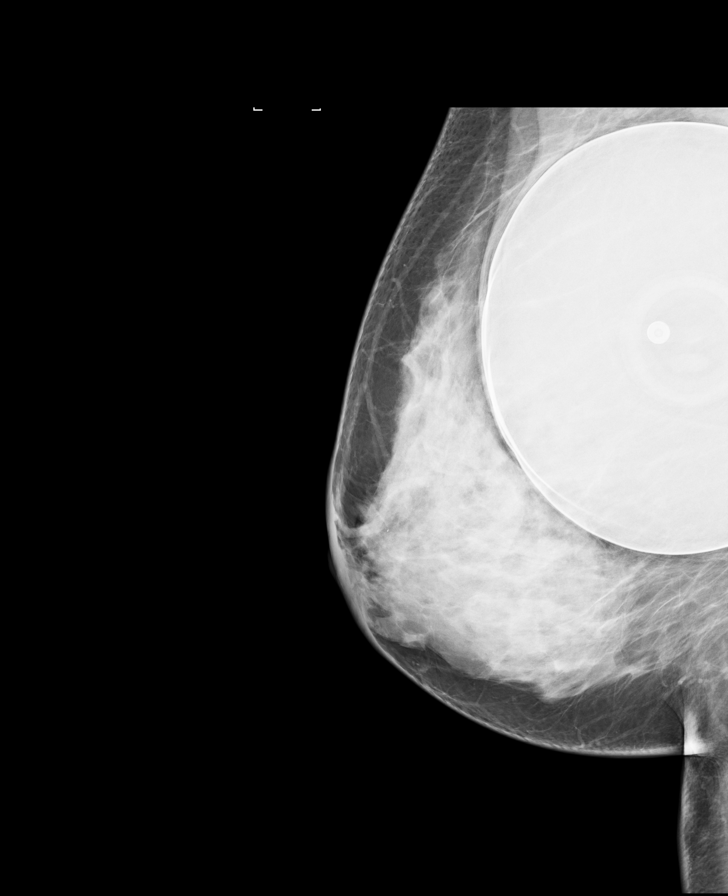

[L CC synth-2D]
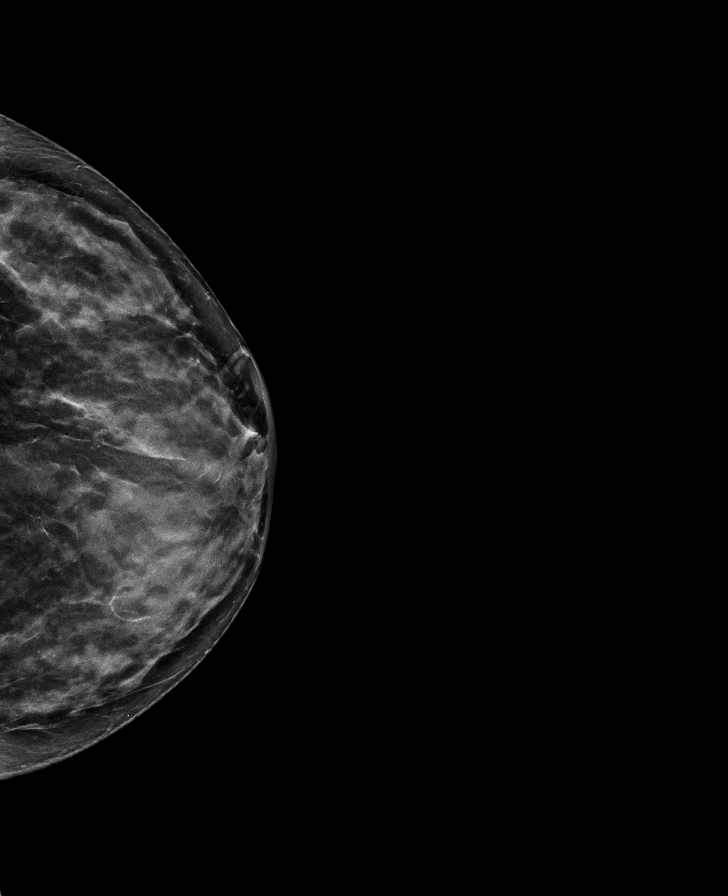

[R MLO synth-2D]
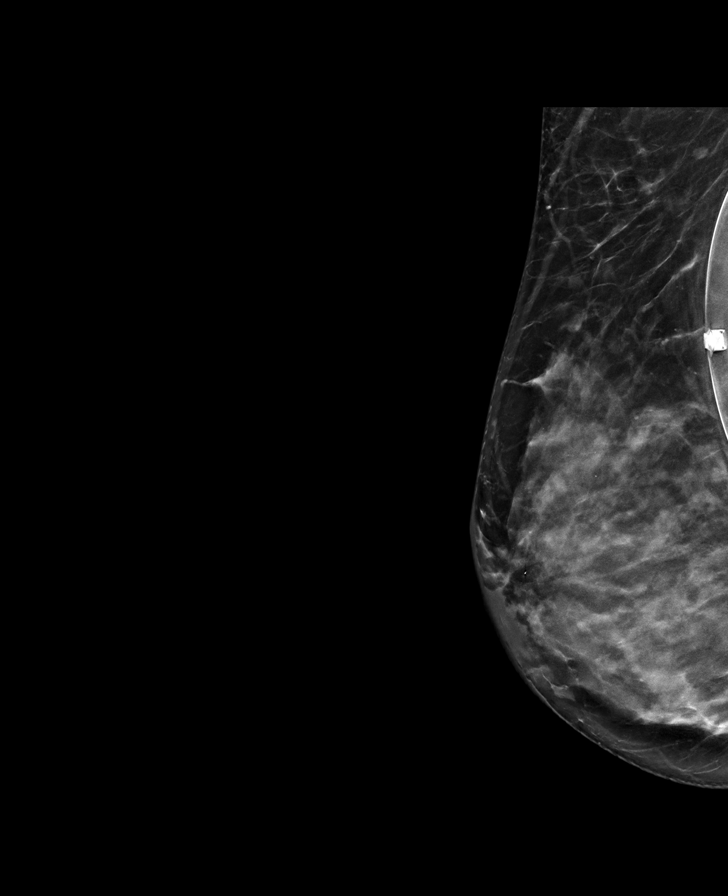

[L MLO synth-2D]
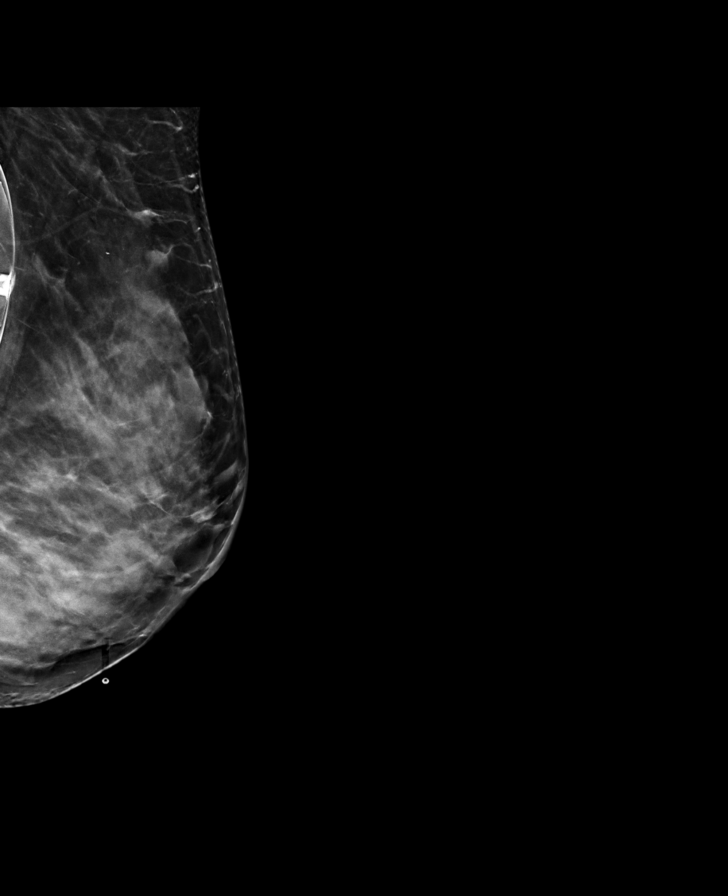

[L TAN synth-2D]
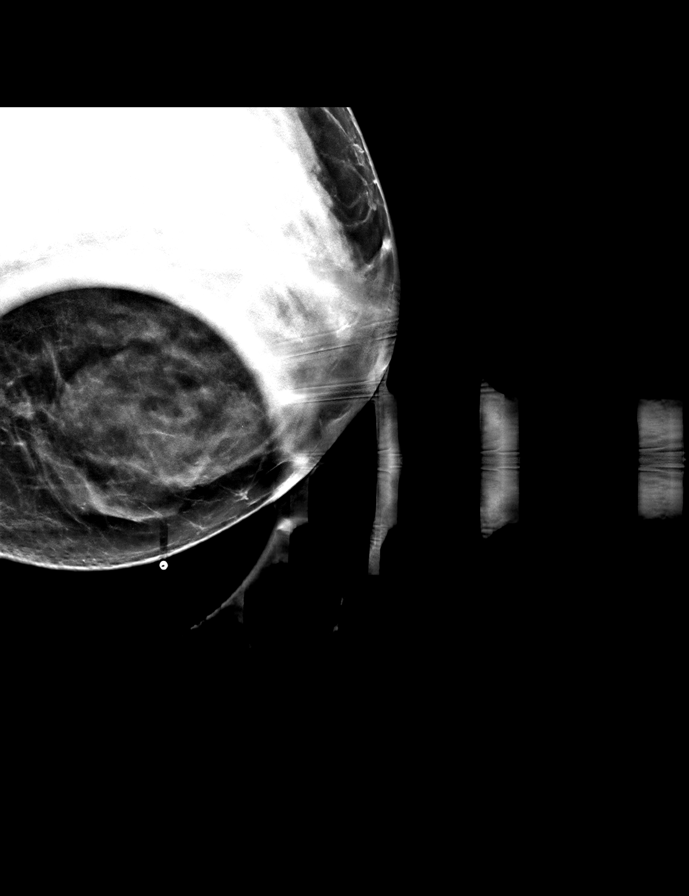

[8 of 34 positions shown; findings below may reference images not displayed]

ACR Breast Density Category d: The breast tissue is extremely dense,
which lowers the sensitivity of mammography.
FINDINGS: There are no new dominant masses, suspicious calcifications or
secondary signs of malignancy within either breast. There is a
partially obscured mass within the lower LEFT breast, corresponding
to the site of a previously demonstrated cyst, perhaps slightly
enlarged compared to the previous exams. The patient has
retropectoral implants.

Targeted ultrasound is performed, showing a benign simple cyst in
the LEFT breast at the 6 o'clock axis, measuring 2.8 x 1.9 x 2 cm,
increased in size compared to the previous ultrasound of 03/27/2020
(previously 2.4 x 1.4 x 1.8 cm), corresponding to the palpable area
of concern. No additional solid or cystic mass is seen within the
surrounding breast tissues of the lower LEFT breast.
IMPRESSION: 1. No evidence of malignancy within either breast.
2. Benign simple cyst in the LEFT breast at the 6 o'clock axis,
measuring 2.8 cm, increased compared to the previous ultrasound of
03/27/2020, corresponding to the palpable area of concern.

RECOMMENDATION:
Screening mammogram in one year.(Code:1B-Y-PF0)

I have discussed the findings and recommendations with the patient.
If applicable, a reminder letter will be sent to the patient
regarding the next appointment.

BI-RADS CATEGORY  2: Benign.

## 2023-12-29 DIAGNOSIS — G4733 Obstructive sleep apnea (adult) (pediatric): Secondary | ICD-10-CM | POA: Diagnosis not present

## 2024-02-11 DIAGNOSIS — R0609 Other forms of dyspnea: Secondary | ICD-10-CM | POA: Diagnosis not present

## 2024-02-11 DIAGNOSIS — I10 Essential (primary) hypertension: Secondary | ICD-10-CM | POA: Diagnosis not present

## 2024-02-11 DIAGNOSIS — Z6830 Body mass index (BMI) 30.0-30.9, adult: Secondary | ICD-10-CM | POA: Diagnosis not present

## 2024-02-11 DIAGNOSIS — R1010 Upper abdominal pain, unspecified: Secondary | ICD-10-CM | POA: Diagnosis not present

## 2024-02-13 DIAGNOSIS — R1010 Upper abdominal pain, unspecified: Secondary | ICD-10-CM | POA: Diagnosis not present

## 2024-02-16 ENCOUNTER — Encounter: Payer: Self-pay | Admitting: *Deleted

## 2024-02-16 DIAGNOSIS — E785 Hyperlipidemia, unspecified: Secondary | ICD-10-CM | POA: Insufficient documentation

## 2024-02-16 DIAGNOSIS — E782 Mixed hyperlipidemia: Secondary | ICD-10-CM | POA: Insufficient documentation

## 2024-02-18 DIAGNOSIS — Z6830 Body mass index (BMI) 30.0-30.9, adult: Secondary | ICD-10-CM | POA: Insufficient documentation

## 2024-02-18 DIAGNOSIS — I10 Essential (primary) hypertension: Secondary | ICD-10-CM | POA: Insufficient documentation

## 2024-02-18 DIAGNOSIS — F419 Anxiety disorder, unspecified: Secondary | ICD-10-CM | POA: Insufficient documentation

## 2024-02-18 DIAGNOSIS — R0609 Other forms of dyspnea: Secondary | ICD-10-CM | POA: Insufficient documentation

## 2024-02-19 ENCOUNTER — Ambulatory Visit

## 2024-02-19 VITALS — BP 130/86 | HR 82 | Ht 67.6 in | Wt 196.2 lb

## 2024-02-19 DIAGNOSIS — E782 Mixed hyperlipidemia: Secondary | ICD-10-CM | POA: Diagnosis not present

## 2024-02-19 DIAGNOSIS — R0609 Other forms of dyspnea: Secondary | ICD-10-CM

## 2024-02-19 DIAGNOSIS — I10 Essential (primary) hypertension: Secondary | ICD-10-CM | POA: Diagnosis not present

## 2024-02-19 MED ORDER — AMLODIPINE BESYLATE 2.5 MG PO TABS: 2.5000 mg | ORAL_TABLET | Freq: Every day | ORAL | 3 refills | Status: AC

## 2024-02-19 NOTE — Progress Notes (Signed)
 Cardiology Consultation:    Date:  02/19/2024   ID:  Stacie Miller, DOB 14-Jun-1966, MRN [id number]  PCP:  Lise Auer, MD  Cardiologist:  Marlyn Corporal Milee Qualls, MD   Referring MD: Lise Auer, MD   No chief complaint on file.    ASSESSMENT AND PLAN:   Ms. Adeyemi 58 year old woman with hypertension, hyperlipidemia, obstructive sleep apnea-uses CPAP, ADD, migraine, chronic anxiety.  Problem List Items Addressed This Visit     Hyperlipidemia   Elevated total cholesterol and borderline LDL levels on prior lipid panel from June 11, 2023 with total cholesterol 230, triglycerides 203, LDL 133, HDL 56.  10-year ASCVD risk calculated 3.8%.  In the setting will recommend continue follow-up with PCP and healthy dietary and lifestyle modifications. If any significant abnormal findings on treadmill EKG will further discuss lipid-lowering therapy.      Relevant Medications   metoprolol succinate (TOPROL-XL) 50 MG 24 hr tablet   amLODipine (NORVASC) 2.5 MG tablet   Benign essential hypertension   Improved control over the last few weeks.  Still suboptimal readings at home occasionally.  In addition to her current dose of metoprolol succinate 25 mg once daily will start amlodipine 2.5 mg once daily. Discussed the mechanism of action and potential side effects. Advised and recommended to continue being adherent with obstructive sleep apnea management with CPAP.  - Recommended to continue metoprolol succinate at the current 25 mg once daily dose and not titrate up at this time. -Start amlodipine 2.5 mg once daily. Continue to maintain log of blood pressure readings at home once a day. Target blood pressure below 130 over 80 mmHg. Once blood pressures are better controlled, we will schedule her for a treadmill EKG stress test to assess functional capacity and to see if her symptoms of dyspnea on exertion are reproducible and if they are associated with any EKG changes suggestive of  ischemia.      Relevant Medications   metoprolol succinate (TOPROL-XL) 50 MG 24 hr tablet   amLODipine (NORVASC) 2.5 MG tablet   Dyspnea on exertion - Primary   Dyspnea on exertion likely related to deconditioning and suboptimal blood pressure control and activity. Cannot entirely exclude angina however overall cardiovascular risk factors are low.  If she continues to have symptoms of dyspnea on exertion despite blood pressure optimization as per plan above, would suggest other secondary causes.  She also plans to start exercising regularly.  In the setting we discussed first optimizing her blood pressure with medication changes as above under hypertension management.  In about 10 to 14 days we will schedule her for a treadmill stress test to assess exercise tolerance, blood pressure response and to rule out any significant ischemia changes on EKG.  If normal she should be clear to proceed with moderate to heavy exertional exercise regimen on a regular basis at home.       Relevant Orders   EKG 12-Lead (Completed)   ECHOCARDIOGRAM COMPLETE   Exercise Tolerance Test   Return to clinic based on test results.    History of Present Illness:    Stacie Miller is a 58 y.o. female who is being seen today for the evaluation of hypertension, chest pain, dyspnea on exertion at the request of Lise Auer, MD.  She has a history of ADHD, obstructive sleep apnea-uses CPAP, hyperlipidemia, migraine, chronic anxiety, family reports was reviewed and noted blood pressures, diagnosed on CPAP titration. No significant prior history of CAD, CHF, MI,  CVA.  Very pleasant woman.  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  Does not exercise routinely.  With recent diagnosis of elevated blood pressures she has been tracking it at home and has readings up to 160 to 170 mmHg at times.  Recently started on metoprolol XL 25 mg once daily by PCP and blood pressure readings have improved over the past week  with readings down to systolic 140s to 130Q.  She has been tolerating this well.  At times she feels lightheaded with sudden change in position or when she is exerting.  No syncopal episode.  No falls.  At times with exertional activities she feels tired and out of breath.  No chest pain.  Plans to embark on regular exercise activities, her daughter apparently brought her Peloton bike.  Does not smoke. Regular alcohol consumption socially. No recreational drug use.  No send any family history of heart disease.  One of the grandmothers with stroke in her 73s.  EKG in the clinic today shows sinus rhythm heart rate 86/min, PR interval 162 ms, QRS duration 78 ms, QTc normal 437 ms.  Remote echocardiogram from [facility] October 2012 reported normal LV systolic function with EF 55%, mild TR with RVSP 45 mmHg.  Last blood work available to review is from 06/11/2023 hemoglobin 13.3, hematocrit 38, WBC 6.9, platelets 333 BUN 15, creatinine 0.8, EGFR greater than 60 Sodium 138, potassium 4.3, normal transaminases and alkaline phosphatase. Lipid panel from 06/11/2023 with total cholesterol 230, triglycerides 203, LDL 133, HDL 56.    Past Medical History:  Diagnosis Date   Attention deficit hyperactivity disorder 02/11/2018   Benign essential hypertension    BMI 29.0-29.9,adult 05/01/2023   BMI 30.0-30.9,adult    Chronic anxiety    Closed fracture of triquetral bone of wrist 12/16/2018   Cyst of left breast 04/03/2021   Dx mammo & [place] 03/2020: 2.4 x 1.8 x 1.4 cm cyst 6-7:00     Dyspnea on exertion    Elevated triglycerides with high cholesterol    Migraine 02/11/2018   OSA (obstructive sleep apnea) 05/01/2023    Past Surgical History:  Procedure Laterality Date   AUGMENTATION MAMMAPLASTY Bilateral    BREAST CYST ASPIRATION Bilateral    LAPAROSCOPIC VAGINAL HYSTERECTOMY  2007    Current Medications: Current Meds  Medication Sig   amitriptyline (ELAVIL) 25 MG tablet Take 25 mg  by mouth at bedtime.   amLODipine (NORVASC) 2.5 MG tablet Take 1 tablet (2.5 mg total) by mouth daily.   Ascorbic Acid (VITAMIN C) 1000 MG tablet Take 1,000 mg by mouth daily.   aspirin EC 81 MG tablet Take 81 mg by mouth daily. Swallow whole.   b complex vitamins capsule Take 1 capsule by mouth daily.   citalopram (CELEXA) 20 MG tablet Take 20 mg by mouth daily.   Cyanocobalamin (B-12) 5000 MCG CAPS Take 5,000 mcg by mouth daily.   Elderberry 500 MG CAPS Take 500 mg by mouth daily.   estradiol (ESTRACE) 1 MG tablet Take 1.5 mg by mouth daily.   lisdexamfetamine (VYVANSE) 60 MG capsule Take 60 mg by mouth every morning.   metoprolol succinate (TOPROL-XL) 50 MG 24 hr tablet Take 25 mg by mouth at bedtime.   montelukast (SINGULAIR) 10 MG tablet Take 10 mg by mouth at bedtime.   valACYclovir (VALTREX) 500 MG tablet Take 500 mg by mouth as directed. For cold sores     Allergies:   Penicillins   Social History   Socioeconomic  History   Marital status: Married    Spouse name: Not on file   Number of children: Not on file   Years of education: Not on file   Highest education level: Not on file  Occupational History   Not on file  Tobacco Use   Smoking status: Never   Smokeless tobacco: Never  Substance and Sexual Activity   Alcohol use: Not on file   Drug use: Not on file   Sexual activity: Not on file  Other Topics Concern   Not on file  Social History Narrative   Not on file   Social Drivers of Health   Financial Resource Strain: Not on file  Food Insecurity: Not on file  Transportation Needs: Not on file  Physical Activity: Not on file  Stress: Not on file  Social Connections: Not on file     Family History: The patient's family history includes Breast cancer in her cousin and another family member; Breast cancer (age of onset: 82) in her cousin; Breast cancer (age of onset: 37) in her cousin; Breast cancer (age of onset: 42) in her mother. ROS:   Please see the  history of present illness.    All 14 point review of systems negative except as described per history of present illness.  EKGs/Labs/Other Studies Reviewed:    The following studies were reviewed today:   EKG:  EKG Interpretation Date/Time:  Thursday February 19 2024 15:35:03 EDT Ventricular Rate:  86 PR Interval:  162 QRS Duration:  78 QT Interval:  366 QTC Calculation: 437 R Axis:   23  Text Interpretation: Normal sinus rhythm Possible Left atrial enlargement Low voltage QRS Inferior infarct , age undetermined Cannot rule out Anterior infarct , age undetermined Abnormal ECG No previous ECGs available Confirmed by Huntley Dec reddy [phone]) on 02/19/2024 4:09:40 PM    Recent Labs: No results found for requested labs within last 365 days.  Recent Lipid Panel No results found for: "CHOL", "TRIG", "HDL", "CHOLHDL", "VLDL", "LDLCALC", "LDLDIRECT"  Physical Exam:    VS:  BP 130/86   Pulse 82   Ht 5' 7.6" (1.717 m)   Wt 196 lb 3.2 oz (89 kg)   SpO2 97%   BMI 30.19 kg/m     Wt Readings from Last 3 Encounters:  02/19/24 196 lb 3.2 oz (89 kg)  02/11/24 196 lb (88.9 kg)     GENERAL:  Well nourished, well developed in no acute distress NECK: No JVD; No carotid bruits CARDIAC: RRR, S1 and S2 present, no murmurs, no rubs, no gallops CHEST:  Clear to auscultation without rales, wheezing or rhonchi  Extremities: No pitting pedal edema. Pulses bilaterally symmetric with radial 2+ and dorsalis pedis 2+ NEUROLOGIC:  Alert and oriented x 3  Medication Adjustments/Labs and Tests Ordered: Current medicines are reviewed at length with the patient today.  Concerns regarding medicines are outlined above.  Orders Placed This Encounter  Procedures   Exercise Tolerance Test   EKG 12-Lead   ECHOCARDIOGRAM COMPLETE   Meds ordered this encounter  Medications   amLODipine (NORVASC) 2.5 MG tablet    Sig: Take 1 tablet (2.5 mg total) by mouth daily.    Dispense:  180 tablet    Refill:   3    Signed, Dann Ventress reddy Cailan Antonucci, MD, MPH, [facility]. 02/19/2024 4:45 PM     [facility]

## 2024-02-19 NOTE — Assessment & Plan Note (Addendum)
 Improved control over the last few weeks.  Still suboptimal readings at home occasionally.  In addition to her current dose of metoprolol succinate 25 mg once daily will start amlodipine 2.5 mg once daily. Discussed the mechanism of action and potential side effects. Advised and recommended to continue being adherent with obstructive sleep apnea management with CPAP.  - Recommended to continue metoprolol succinate at the current 25 mg once daily dose and not titrate up at this time. -Start amlodipine 2.5 mg once daily. Continue to maintain log of blood pressure readings at home once a day. Target blood pressure below 130 over 80 mmHg. Once blood pressures are better controlled, we will schedule her for a treadmill EKG stress test to assess functional capacity and to see if her symptoms of dyspnea on exertion are reproducible and if they are associated with any EKG changes suggestive of ischemia.

## 2024-02-19 NOTE — Patient Instructions (Signed)
 Medication Instructions:  Start Amlodipine 2.5 mg once a day.  *If you need a refill on your cardiac medications before your next appointment, please call your pharmacy*   Lab Work: None Ordered If you have labs (blood work) drawn today and your tests are completely normal, you will receive your results only by: MyChart Message (if you have MyChart) OR A paper copy in the mail If you have any lab test that is abnormal or we need to change your treatment, we will call you to review the results.   Testing/Procedures: Treadmill Stress Test Instructions:    1. You may take all of your medications except Metoprolol.  2. No food, drink or tobacco products 2 hours prior to your test.  3. Dress prepared to exercise. Best to wear 2 piece outfit and tennis shoes. Shoes must be closed toe.  4. Please bring all current prescription medications.    Echocardiogram An echocardiogram is a test that uses sound waves (ultrasound) to produce images of the heart. Images from an echocardiogram can provide important information about: Heart size and shape. The size and thickness and movement of your heart's walls. Heart muscle function and strength. Heart valve function or if you have stenosis. Stenosis is when the heart valves are too narrow. If blood is flowing backward through the heart valves (regurgitation). A tumor or infectious growth around the heart valves. Areas of heart muscle that are not working well because of poor blood flow or injury from a heart attack. Aneurysm detection. An aneurysm is a weak or damaged part of an artery wall. The wall bulges out from the normal force of blood pumping through the body. Tell a health care provider about: Any allergies you have. All medicines you are taking, including vitamins, herbs, eye drops, creams, and over-the-counter medicines. Any blood disorders you have. Any surgeries you have had. Any medical conditions you have. Whether you are  pregnant or may be pregnant. What are the risks? Generally, this is a safe test. However, problems may occur, including an allergic reaction to dye (contrast) that may be used during the test. What happens before the test? No specific preparation is needed. You may eat and drink normally. What happens during the test?  You will take off your clothes from the waist up and put on a hospital gown. Electrodes or electrocardiogram (ECG)patches may be placed on your chest. The electrodes or patches are then connected to a device that monitors your heart rate and rhythm. You will lie down on a table for an ultrasound exam. A gel will be applied to your chest to help sound waves pass through your skin. A handheld device, called a transducer, will be pressed against your chest and moved over your heart. The transducer produces sound waves that travel to your heart and bounce back (or "echo" back) to the transducer. These sound waves will be captured in real-time and changed into images of your heart that can be viewed on a video monitor. The images will be recorded on a computer and reviewed by your health care provider. You may be asked to change positions or hold your breath for a short time. This makes it easier to get different views or better views of your heart. In some cases, you may receive contrast through an IV in one of your veins. This can improve the quality of the pictures from your heart. The procedure may vary among health care providers and hospitals. What can I expect after the test?  You may return to your normal, everyday life, including diet, activities, and medicines, unless your health care provider tells you not to do that. Follow these instructions at home: It is up to you to get the results of your test. Ask your health care provider, or the department that is doing the test, when your results will be ready. Keep all follow-up visits. This is important. Summary An echocardiogram  is a test that uses sound waves (ultrasound) to produce images of the heart. Images from an echocardiogram can provide important information about the size and shape of your heart, heart muscle function, heart valve function, and other possible heart problems. You do not need to do anything to prepare before this test. You may eat and drink normally. After the echocardiogram is completed, you may return to your normal, everyday life, unless your health care provider tells you not to do that. This information is not intended to replace advice given to you by your health care provider. Make sure you discuss any questions you have with your health care provider. Document Revised: 08/01/2021 Document Reviewed: 07/11/2020 Elsevier Patient Education  2023 Elsevier Inc.     Follow-Up: At [facility], you and your health needs are our priority.  As part of our continuing mission to provide you with exceptional heart care, we have created designated Provider Care Teams.  These Care Teams include your primary Cardiologist (physician) and Advanced Practice Providers (APPs -  Physician Assistants and Nurse Practitioners) who all work together to provide you with the care you need, when you need it.  We recommend signing up for the patient portal called "MyChart".  Sign up information is provided on this After Visit Summary.  MyChart is used to connect with patients for Virtual Visits (Telemedicine).  Patients are able to view lab/test results, encounter notes, upcoming appointments, etc.  Non-urgent messages can be sent to your provider as well.   To learn more about what you can do with MyChart, go to [email or web address].    Your next appointment:   Based on test results

## 2024-02-19 NOTE — Assessment & Plan Note (Signed)
 Elevated total cholesterol and borderline LDL levels on prior lipid panel from June 11, 2023 with total cholesterol 230, triglycerides 203, LDL 133, HDL 56.  10-year ASCVD risk calculated 3.8%.  In the setting will recommend continue follow-up with PCP and healthy dietary and lifestyle modifications. If any significant abnormal findings on treadmill EKG will further discuss lipid-lowering therapy.

## 2024-02-19 NOTE — Assessment & Plan Note (Signed)
 Dyspnea on exertion likely related to deconditioning and suboptimal blood pressure control and activity. Cannot entirely exclude angina however overall cardiovascular risk factors are low.  If she continues to have symptoms of dyspnea on exertion despite blood pressure optimization as per plan above, would suggest other secondary causes.  She also plans to start exercising regularly.  In the setting we discussed first optimizing her blood pressure with medication changes as above under hypertension management.  In about 10 to 14 days we will schedule her for a treadmill stress test to assess exercise tolerance, blood pressure response and to rule out any significant ischemia changes on EKG.  If normal she should be clear to proceed with moderate to heavy exertional exercise regimen on a regular basis at home.

## 2024-02-25 ENCOUNTER — Telehealth (HOSPITAL_COMMUNITY): Payer: Self-pay | Admitting: *Deleted

## 2024-02-25 NOTE — Telephone Encounter (Signed)
 Patient given reminder call for upcoming GXT on 03/02/24 at 8:30

## 2024-03-02 ENCOUNTER — Ambulatory Visit

## 2024-03-02 DIAGNOSIS — R0609 Other forms of dyspnea: Secondary | ICD-10-CM | POA: Diagnosis not present

## 2024-03-04 LAB — EXERCISE TOLERANCE TEST
Angina Index: 0
Duke Treadmill Score: 7
Estimated workload: 10.1
Exercise duration (min): 7 min
MPHR: 162 {beats}/min
Peak HR: 155 {beats}/min
Percent HR: 95 %
Rest HR: 83 {beats}/min
ST Depression (mm): 0.5 mm

## 2024-03-10 ENCOUNTER — Ambulatory Visit

## 2024-04-02 ENCOUNTER — Ambulatory Visit

## 2024-04-02 DIAGNOSIS — R0609 Other forms of dyspnea: Secondary | ICD-10-CM

## 2024-04-04 LAB — ECHOCARDIOGRAM COMPLETE
Area-P 1/2: 3.88 cm2
S' Lateral: 2.4 cm

## 2024-05-12 ENCOUNTER — Other Ambulatory Visit: Payer: Self-pay | Admitting: Obstetrics and Gynecology

## 2024-05-12 DIAGNOSIS — Z1231 Encounter for screening mammogram for malignant neoplasm of breast: Secondary | ICD-10-CM

## 2024-05-27 ENCOUNTER — Ambulatory Visit

## 2024-06-02 ENCOUNTER — Ambulatory Visit
Admission: RE | Admit: 2024-06-02 | Discharge: 2024-06-02 | Disposition: A | Discharge status: Home / Self Care | Source: Ambulatory Visit | Attending: Obstetrics and Gynecology | Admitting: Obstetrics and Gynecology

## 2024-06-02 DIAGNOSIS — Z1231 Encounter for screening mammogram for malignant neoplasm of breast: Secondary | ICD-10-CM | POA: Diagnosis not present

## 2024-06-02 DIAGNOSIS — Z01419 Encounter for gynecological examination (general) (routine) without abnormal findings: Secondary | ICD-10-CM | POA: Diagnosis not present

## 2024-07-06 ENCOUNTER — Ambulatory Visit: Payer: Self-pay
